# Patient Record
Sex: Female | Born: 1960 | Race: Black or African American | Hispanic: No | Marital: Married | Smoking: Never smoker
Health system: Southern US, Community
[De-identification: ages and names within clinical notes are randomized; demographics above are authoritative.]

## PROBLEM LIST (undated history)

## (undated) DIAGNOSIS — R7303 Prediabetes: Secondary | ICD-10-CM

---

## 2008-12-18 ENCOUNTER — Ambulatory Visit: Payer: Self-pay | Admitting: Internal Medicine

## 2010-10-15 IMAGING — CR DG HIP COMPLETE 2+V*L*
1 series · 2 of 2 positions shown · non-contrast
Comparison: None

REASON FOR EXAM: hip pain Please send result back to office [phone]
office [phone]
COMMENTS:

PROCEDURE:     DXR - DXR HIP LEFT COMPLETE  - December 18, 2008  [DATE]
RESULT:     History: Pain

[Series 1: view not recorded · 0.17mm/px · 2 of 2 slices shown]
[im 1/2]
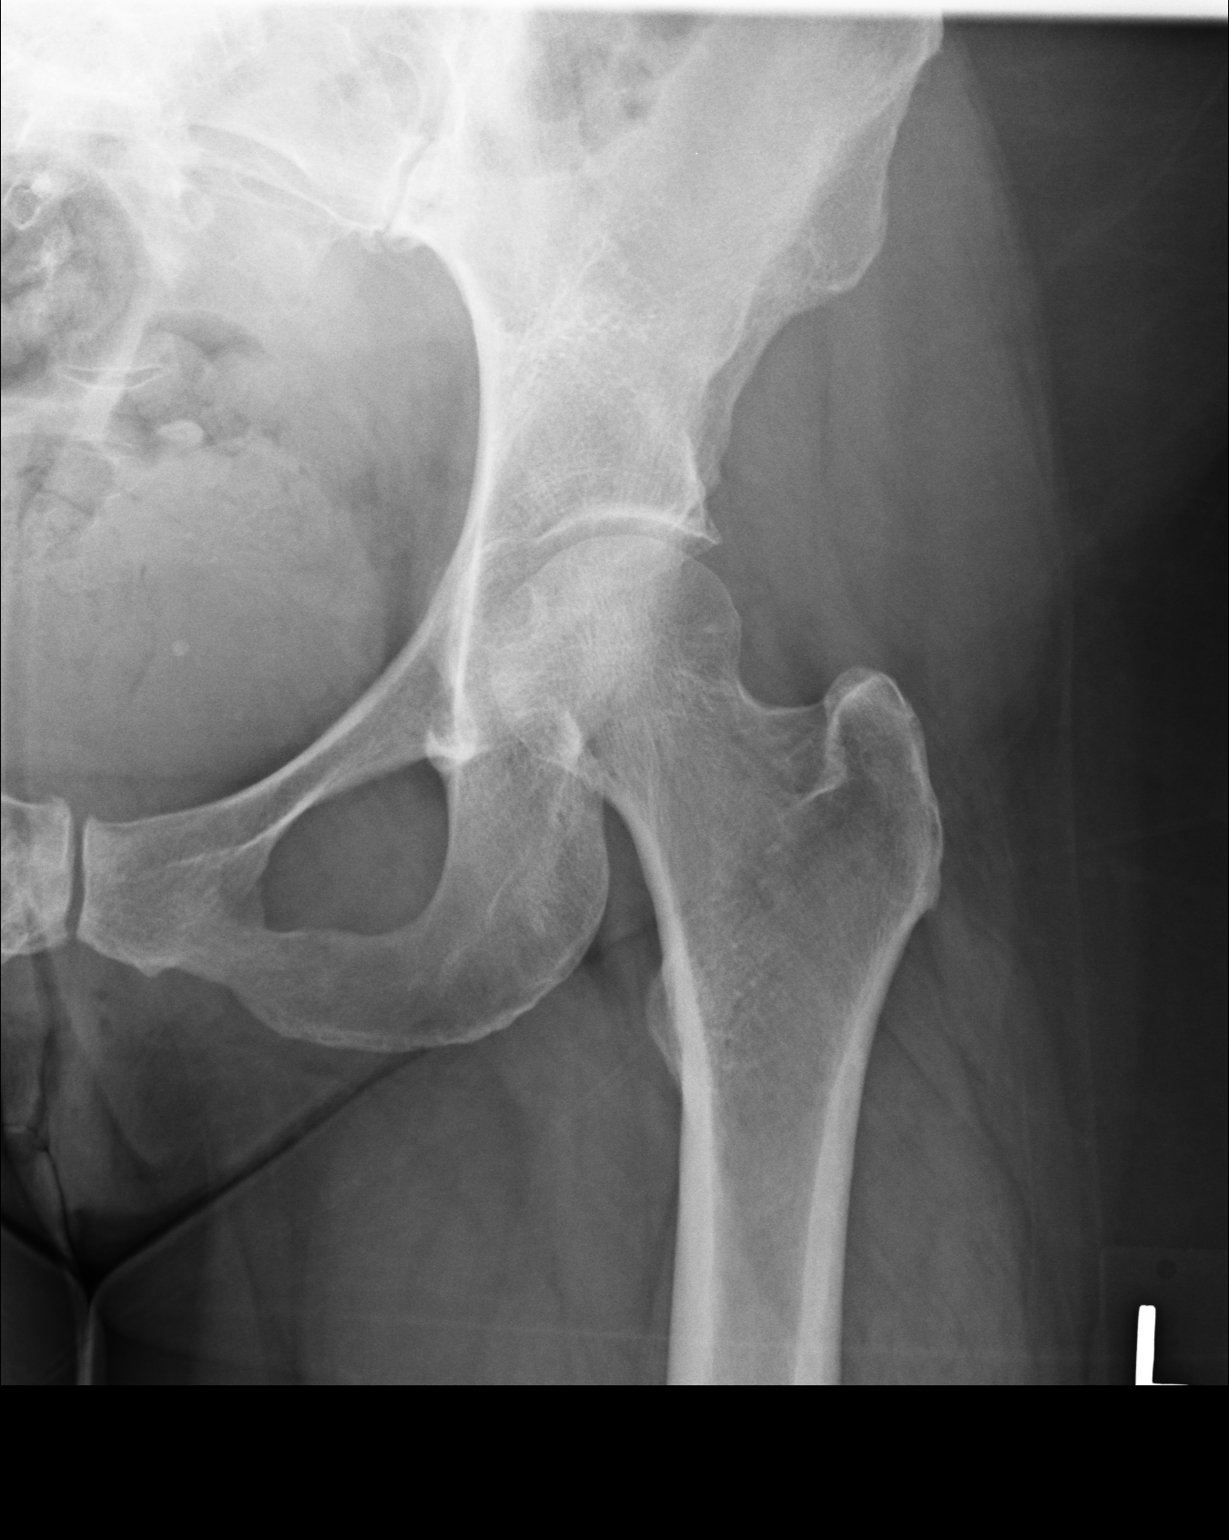
[im 2/2]
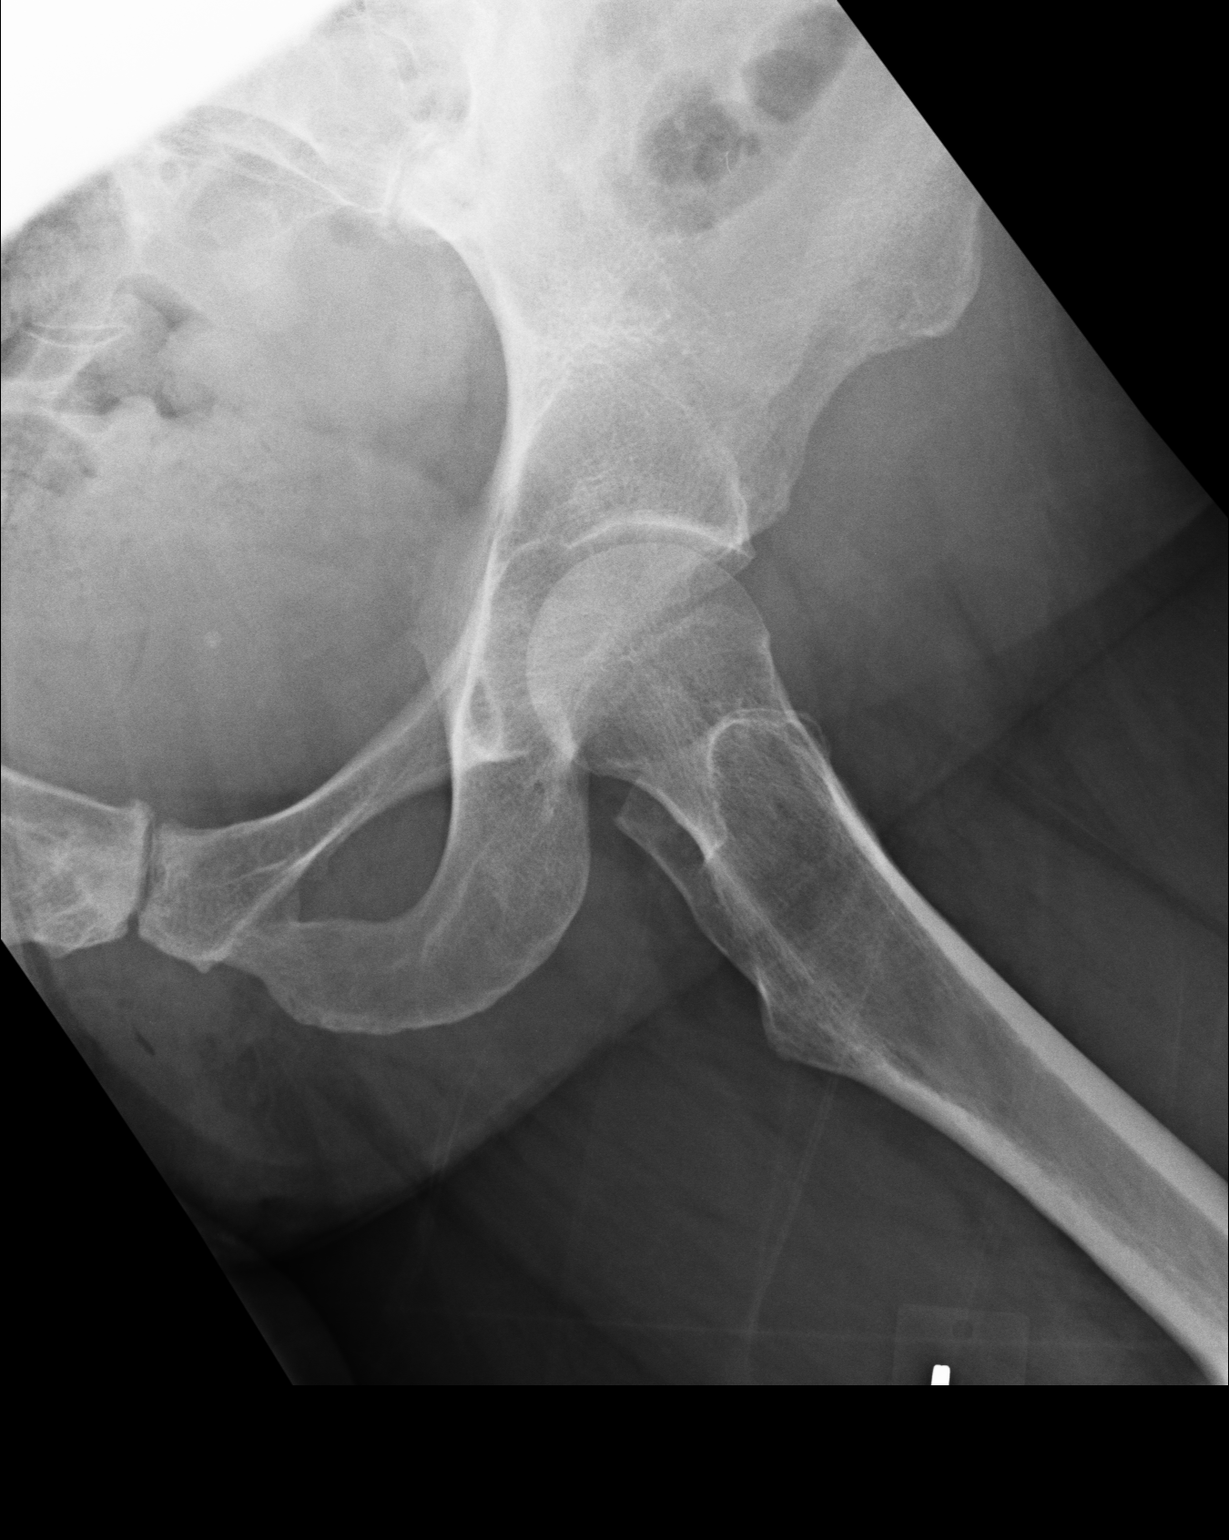

[2 of 2 positions shown; findings below may reference images not displayed]

FINDINGS: AP and frogleg lateral view of the left hip demonstrates no fracture or
dislocation. The joint spaces maintained.

The visualized left sacroiliac joint is unremarkable. There are mild
degenerative changes of the pubic symphysis.
IMPRESSION: No acute osseous injury of the left hip.

## 2010-10-15 IMAGING — US US EXTREM LOW VENOUS*L*
1 series · 17 of 24 positions shown · non-contrast
Comparison: none

REASON FOR EXAM: [id number]  PAIN AND SWELLING EVAL DVT
COMMENTS:

PROCEDURE:     US  - US DOPPLER LOW EXTR LEFT  - December 18, 2008  [DATE]
RESULT:     Comparison: No comparison

[Series 1: us extrem low venous*left* · 17 of 26 slices shown]
[im 1/26]
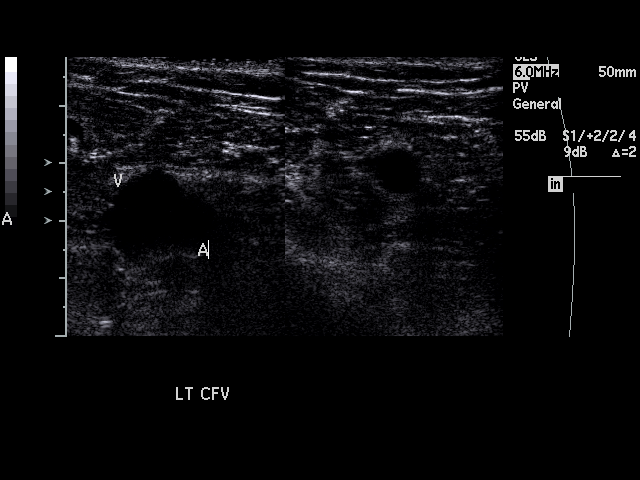
[im 3/26]
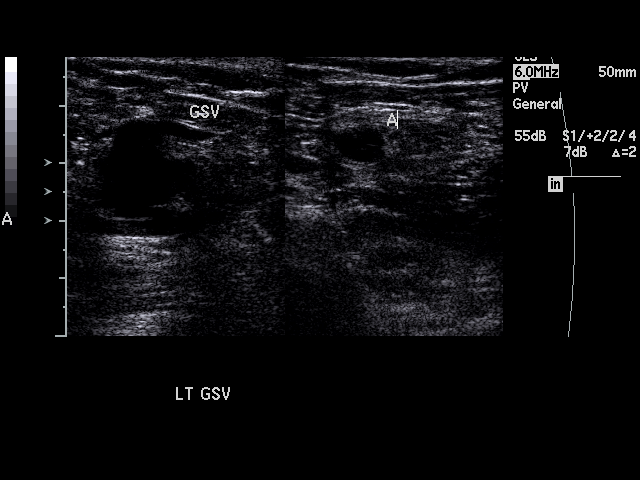
[im 4/26]
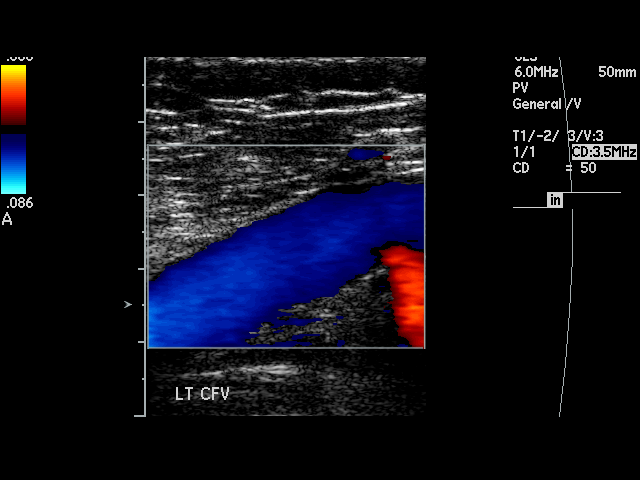
[im 5/26]
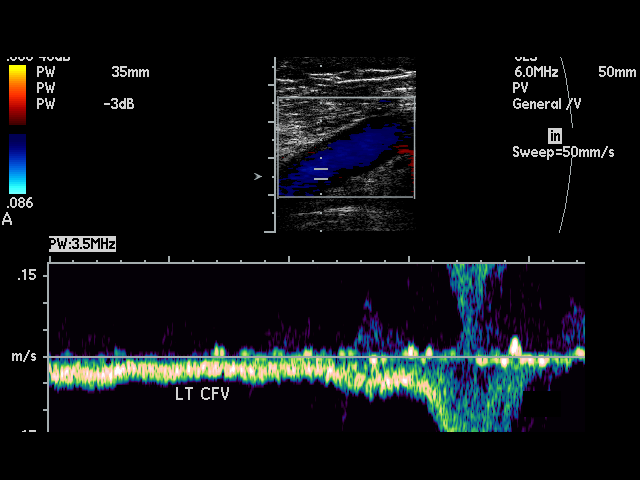
[im 7/26]
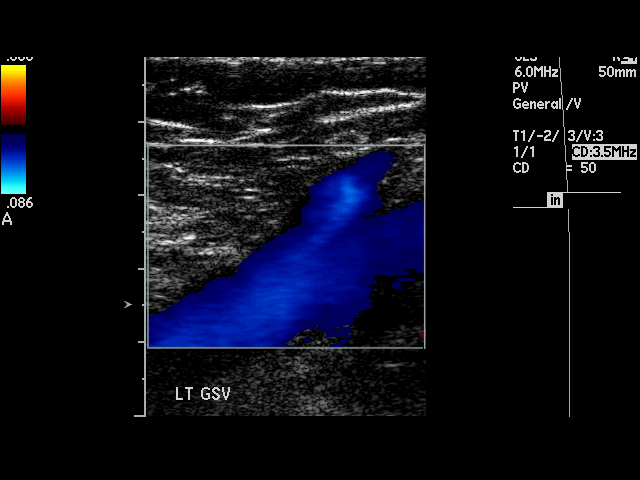
[im 8/26]
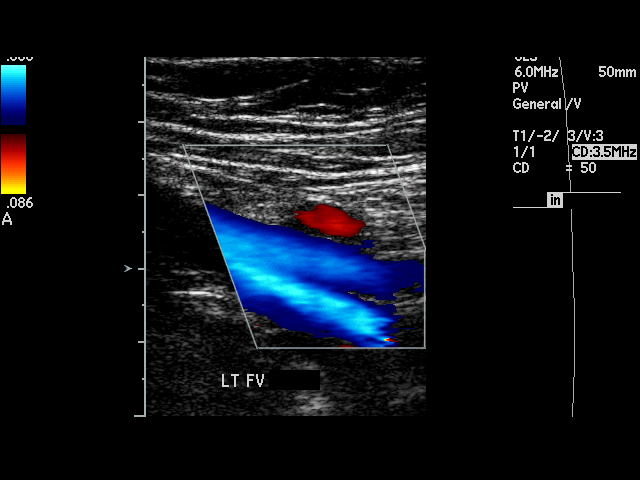
[im 10/26]
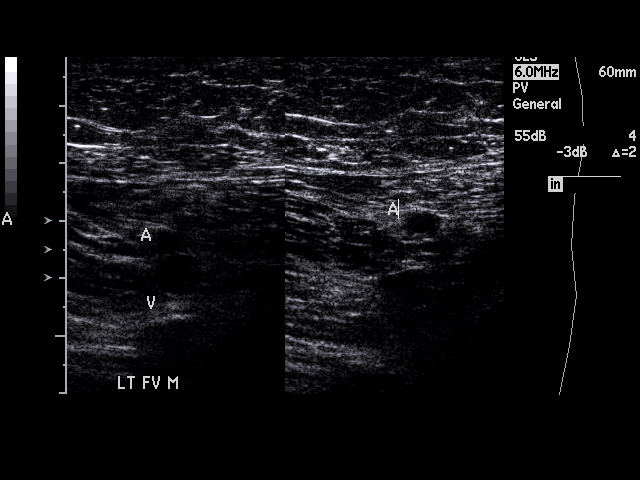
[im 11/26]
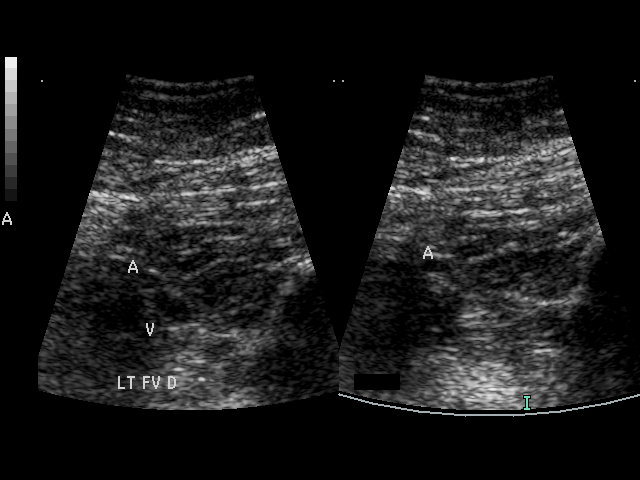
[im 14/26]
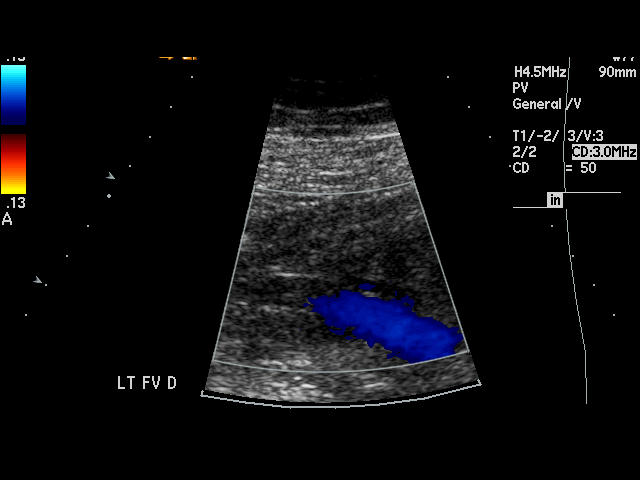
[im 15/26]
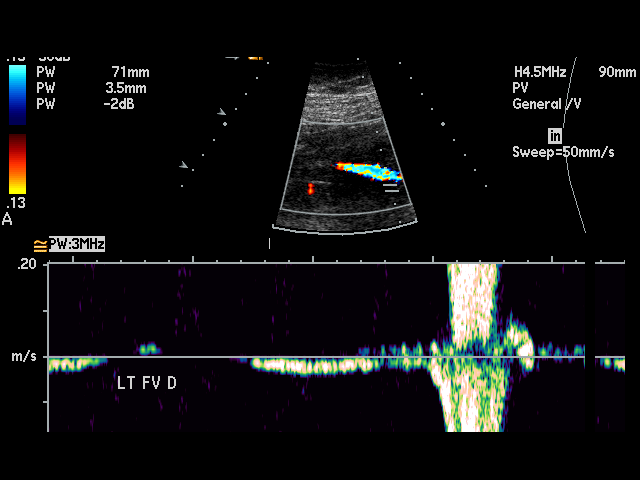
[im 16/26]
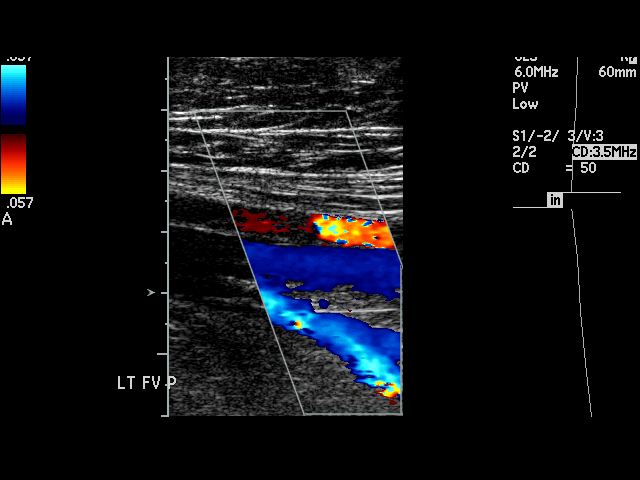
[im 18/26]
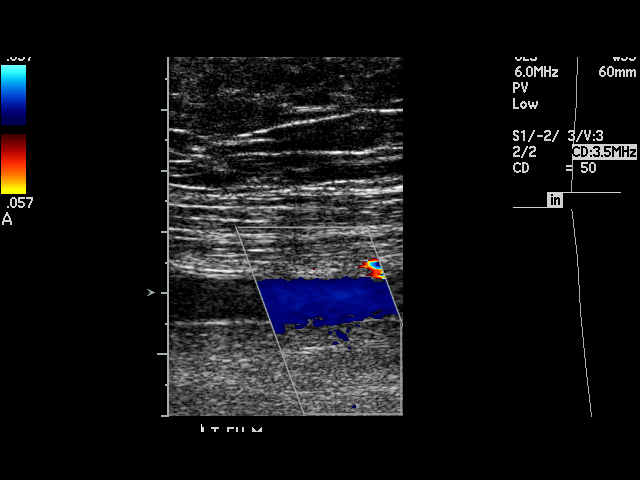
[im 19/26]
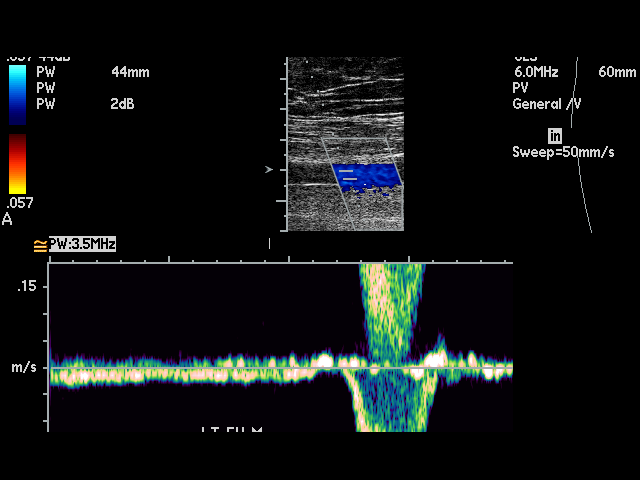
[im 21/26]
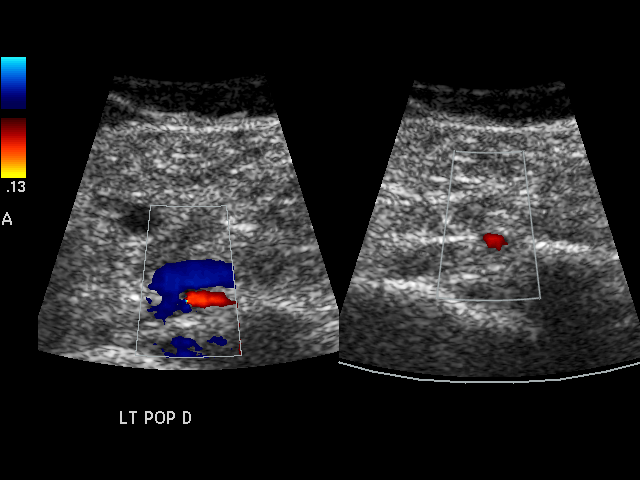
[im 22/26]
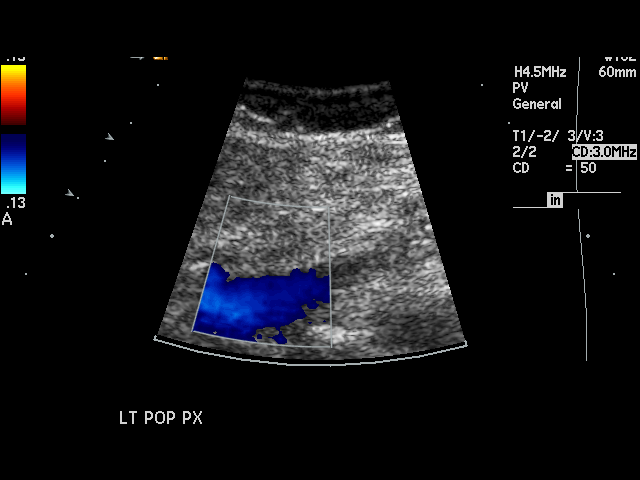
[im 23/26]
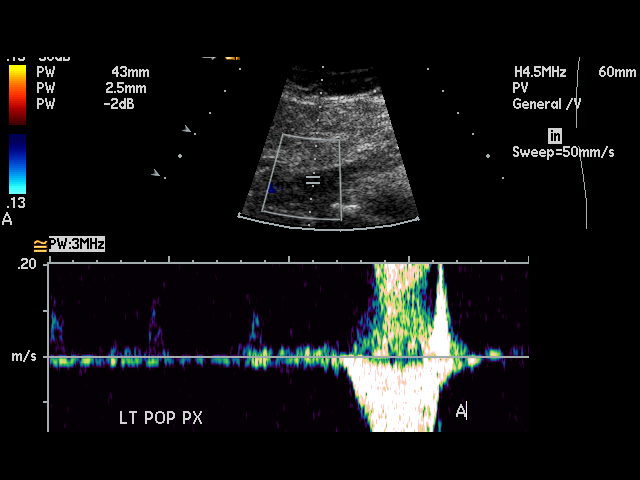
[im 26/26]
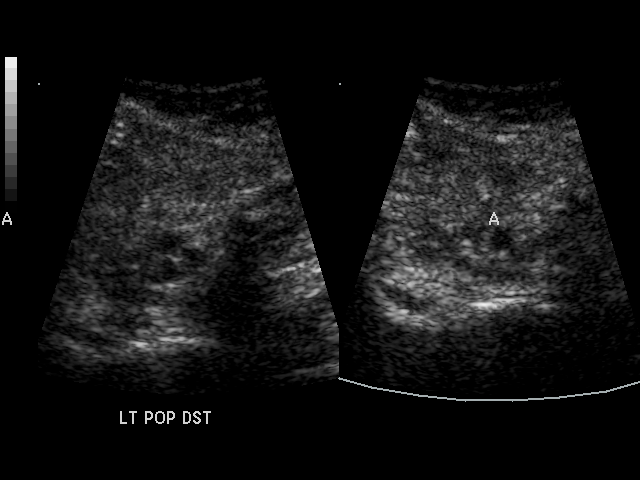

[17 of 24 positions shown; findings below may reference images not displayed]

FINDINGS: Multiple longitudinal and transverse gray-scale as well as color
and spectral Doppler images of the left lower extremity veins were obtained
from the common femoral veins through the popliteal veins.

The left common femoral, greater saphenous, femoral, popliteal veins, and
venous trifurcation are patent, demonstrating normal color-flow and
compressibility. No intraluminal thrombus is identified.There is normal
respiratory variation and augmentation demonstrated at all vein levels.
IMPRESSION: No evidence of DVT in the left lower extremity.

## 2019-12-27 ENCOUNTER — Ambulatory Visit: Payer: Self-pay | Attending: Family

## 2019-12-27 DIAGNOSIS — Z23 Encounter for immunization: Secondary | ICD-10-CM

## 2019-12-27 NOTE — Progress Notes (Signed)
   Covid-19 Vaccination Clinic  Name:  Sherry Chaney    MRN: [id number] DOB: 18-Mar-1961  12/27/2019  [name] was observed post Covid-19 immunization for 15 minutes without incident. She was provided with Vaccine Information Sheet and instruction to access the V-Safe system.   [name] was instructed to call 911 with any severe reactions post vaccine: Marland Kitchen Difficulty breathing  . Swelling of face and throat  . A fast heartbeat  . A bad rash all over body  . Dizziness and weakness   Immunizations Administered    Name Date Dose VIS Date Route   Moderna COVID-19 Vaccine 12/27/2019  1:31 PM 0.5 mL 09/04/2019 Intramuscular   Manufacturer: Moderna   Lot: [id number]   NDC: 38333-832-91

## 2020-01-29 ENCOUNTER — Ambulatory Visit: Payer: Self-pay | Attending: Family

## 2020-01-29 DIAGNOSIS — Z23 Encounter for immunization: Secondary | ICD-10-CM

## 2020-01-29 NOTE — Progress Notes (Signed)
   Covid-19 Vaccination Clinic  Name:  Sherry Chaney    MRN: [id number] DOB: [birth year]  01/29/2020  Ms. Raphael was observed post Covid-19 immunization for 15 minutes without incident. She was provided with Vaccine Information Sheet and instruction to access the V-Safe system.   Ms. Severtson was instructed to call 911 with any severe reactions post vaccine: Marland Kitchen Difficulty breathing  . Swelling of face and throat  . A fast heartbeat  . A bad rash all over body  . Dizziness and weakness   Immunizations Administered    Name Date Dose VIS Date Route   Moderna COVID-19 Vaccine 01/29/2020  4:15 PM 0.5 mL 09/2019 Intramuscular   Manufacturer: Moderna   Lot: [id number]   NDC: 29924-268-34

## 2020-08-22 ENCOUNTER — Ambulatory Visit: Payer: Self-pay

## 2020-09-02 ENCOUNTER — Ambulatory Visit: Payer: Self-pay | Attending: Internal Medicine

## 2020-09-02 DIAGNOSIS — Z23 Encounter for immunization: Secondary | ICD-10-CM

## 2020-09-02 NOTE — Progress Notes (Signed)
   Covid-19 Vaccination Clinic  Name:  Sherry Chaney    MRN: [id number] DOB: May 19, 1961  09/02/2020  [name] was observed post Covid-19 immunization for 15 minutes without incident. She was provided with Vaccine Information Sheet and instruction to access the V-Safe system.   [name] was instructed to call 911 with any severe reactions post vaccine: Marland Kitchen Difficulty breathing  . Swelling of face and throat  . A fast heartbeat  . A bad rash all over body  . Dizziness and weakness   Immunizations Administered    No immunizations on file.

## 2023-01-22 ENCOUNTER — Ambulatory Visit (HOSPITAL_COMMUNITY)
Admission: EM | Admit: 2023-01-22 | Discharge: 2023-01-22 | Disposition: A | Discharge status: Home / Self Care | Payer: Managed Care, Other (non HMO) | Attending: Physician Assistant | Admitting: Physician Assistant

## 2023-01-22 ENCOUNTER — Encounter (HOSPITAL_COMMUNITY): Payer: Self-pay

## 2023-01-22 DIAGNOSIS — Z760 Encounter for issue of repeat prescription: Secondary | ICD-10-CM | POA: Diagnosis present

## 2023-01-22 DIAGNOSIS — R7309 Other abnormal glucose: Secondary | ICD-10-CM | POA: Diagnosis present

## 2023-01-22 DIAGNOSIS — R7303 Prediabetes: Secondary | ICD-10-CM | POA: Diagnosis present

## 2023-01-22 HISTORY — DX: Prediabetes: R73.03

## 2023-01-22 LAB — COMPREHENSIVE METABOLIC PANEL
ALT: 24 U/L (ref 0–44)
AST: 25 U/L (ref 15–41)
Albumin: 3.5 g/dL (ref 3.5–5.0)
Alkaline Phosphatase: 49 U/L (ref 38–126)
Anion gap: 8 (ref 5–15)
BUN: 10 mg/dL (ref 8–23)
CO2: 25 mmol/L (ref 22–32)
Calcium: 9.2 mg/dL (ref 8.9–10.3)
Chloride: 105 mmol/L (ref 98–111)
Creatinine, Ser: 0.71 mg/dL (ref 0.44–1.00)
GFR, Estimated: >60 mL/min (ref >60)
Glucose, Bld: 86 mg/dL (ref 70–99)
Potassium: 3.6 mmol/L (ref 3.5–5.1)
Sodium: 138 mmol/L (ref 135–145)
Total Bilirubin: 0.2 mg/dL — ABNORMAL LOW (ref 0.3–1.2)
Total Protein: 6.5 g/dL (ref 6.5–8.1)

## 2023-01-22 LAB — HEMOGLOBIN A1C
Hgb A1c MFr Bld: 6 % — ABNORMAL HIGH (ref 4.8–5.6)
Mean Plasma Glucose: 125.5 mg/dL

## 2023-01-22 LAB — POCT URINALYSIS DIP (MANUAL ENTRY)
Bilirubin, UA: NEGATIVE
Glucose, UA: NEGATIVE mg/dL
Ketones, POC UA: NEGATIVE mg/dL
Leukocytes, UA: NEGATIVE
Nitrite, UA: NEGATIVE
Protein Ur, POC: NEGATIVE mg/dL
Spec Grav, UA: >=1.03 — AB (ref 1.010–1.025)
Urobilinogen, UA: 0.2 E.U./dL
pH, UA: 5.5 (ref 5.0–8.0)

## 2023-01-22 MED ORDER — METFORMIN HCL ER 500 MG PO TB24: ORAL_TABLET | ORAL | 1 refills | Status: AC

## 2023-01-22 NOTE — ED Triage Notes (Signed)
Pt states that she is here for a medication refill for her metformin.

## 2023-01-22 NOTE — ED Provider Notes (Signed)
[facility]    CSN: [id number] Arrival date & time: 01/22/23  1050      History   Chief Complaint Chief Complaint  Patient presents with   Medication Refill    HPI Sherry Chaney is a 62 y.o. female.   Patient presents today for medication refill.  Several years ago she was noted to have slightly elevated A1c in the prediabetes range and was started on metformin 500 mg nightly.  She has made significant changes to her lifestyle including increasing her exercise to 3 times a week and eating a very healthy diet.  As result she has lost approximately 58 pounds since initially being diagnosed.  She is doing well on metformin 500 mg extended release at night and is requesting a refill of this medicine.  Denies any associated side effects including GI upset.  She does not monitor her blood sugar at home.  She recently moved to the area does not have a primary care and is interested in establishing with someone.  She denies any polyuria, polydipsia, polyphagia.    Past Medical History:  Diagnosis Date   Pre-diabetes     There are no problems to display for this patient.   Past Surgical History:  Procedure Laterality Date   CESAREAN SECTION      OB History   No obstetric history on file.      Home Medications    Prior to Admission medications   Medication Sig Start Date End Date Taking? Authorizing Provider  metFORMIN (GLUCOPHAGE-XR) 500 MG 24 hr tablet SMARTSIG:1 Tablet(s) By Mouth Every Evening 01/22/23   Cassandra Mcmanaman, Noberto Retort, PA-C    Family History History reviewed. No pertinent family history.  Social History Social History   Tobacco Use   Smoking status: Never    Passive exposure: Never   Smokeless tobacco: Never  Vaping Use   Vaping Use: Never used  Substance Use Topics   Alcohol use: Not Currently   Drug use: Never     Allergies   Patient has no known allergies.   Review of Systems Review of Systems  Constitutional:  Negative for activity  change, appetite change, fatigue and fever.  Respiratory:  Negative for cough and shortness of breath.   Cardiovascular:  Negative for chest pain.  Gastrointestinal:  Negative for abdominal pain, diarrhea, nausea and vomiting.  Endocrine: Negative for polydipsia, polyphagia and polyuria.  Neurological:  Negative for dizziness, light-headedness and headaches.     Physical Exam Triage Vital Signs ED Triage Vitals  Enc Vitals Group     BP 01/22/23 1135 (!) 94/57     Pulse Rate 01/22/23 1135 68     Resp 01/22/23 1135 18     Temp 01/22/23 1135 98.3 F (36.8 C)     Temp Source 01/22/23 1135 Oral     SpO2 01/22/23 1135 96 %     Weight 01/22/23 1133 178 lb (80.7 kg)     Height --      Head Circumference --      Peak Flow --      Pain Score 01/22/23 1133 0     Pain Loc --      Pain Edu? --      Excl. in GC? --    No data found.  Updated Vital Signs BP 98/63 (BP Location: Right Arm)   Pulse 68   Temp 98.3 F (36.8 C) (Oral)   Resp 18   Wt 178 lb (80.7 kg)   SpO2  96%   Visual Acuity Right Eye Distance:   Left Eye Distance:   Bilateral Distance:    Right Eye Near:   Left Eye Near:    Bilateral Near:     Physical Exam Vitals reviewed.  Constitutional:      General: She is awake. She is not in acute distress.    Appearance: Normal appearance. She is well-developed. She is not ill-appearing.     Comments: Very pleasant female appears stated age in no acute distress sitting comfortably in exam room  HENT:     Head: Normocephalic and atraumatic.  Cardiovascular:     Rate and Rhythm: Normal rate and regular rhythm.     Heart sounds: Normal heart sounds, S1 normal and S2 normal. No murmur heard. Pulmonary:     Effort: Pulmonary effort is normal.     Breath sounds: Normal breath sounds. No wheezing, rhonchi or rales.     Comments: Clear to auscultation bilaterally Abdominal:     General: Bowel sounds are normal.     Palpations: Abdomen is soft.     Tenderness: There is  no abdominal tenderness. There is no right CVA tenderness, left CVA tenderness, guarding or rebound.  Musculoskeletal:     Right lower leg: No edema.     Left lower leg: No edema.  Psychiatric:        Behavior: Behavior is cooperative.      UC Treatments / Results  Labs (all labs ordered are listed, but only abnormal results are displayed) Labs Reviewed  POCT URINALYSIS DIP (MANUAL ENTRY) - Abnormal; Notable for the following components:      Result Value   Spec Grav, UA >=1.030 (*)    Blood, UA small (*)    All other components within normal limits  COMPREHENSIVE METABOLIC PANEL  HEMOGLOBIN A1C    EKG   Radiology No results found.  Procedures Procedures (including critical care time)  Medications Ordered in UC Medications - No data to display  Initial Impression / Assessment and Plan / UC Course  I have reviewed the triage vital signs and the nursing notes.  Pertinent labs & imaging results that were available during my care of the patient were reviewed by me and considered in my medical decision making (see chart for details).     Patient is well-appearing, afebrile, nontoxic, nontachycardic.  She is doing well on her current medication regimen and refill was provided per her request.  Basic labs including CMP and A1c were obtained and we will contact her if these are abnormal we need to change her treatment plan.  She was encouraged to continue with healthy lifestyle and weight loss efforts.  She does not currently have a primary care so we will try to establish her with someone via PCP assistance.  Discussed that if she has difficulty establishing with a primary care she can return here as needed.  If she develops any symptoms she is to return for reevaluation.  Her blood pressure was slightly low on intake.  Patient reports that it has been running lower since she is lost weight and she denies any lightheadedness, weakness, dizziness.  Urine was obtained that did show  increase specific gravity and she was encouraged to push fluids.  Recommended that she monitor her blood pressure at home and if this drops below 90/60 she should be reevaluated or if she develops any symptoms.  She expressed understanding.  Recommended that ideally I would like her to return here or see  primary care within a few weeks for recheck.  All questions answered to patient satisfaction.  Final Clinical Impressions(s) / UC Diagnoses   Final diagnoses:  Prediabetes  Dysglycemia  Encounter for medication refill     Discharge Instructions      I have called in a refill of your metformin.  Please take this as previously prescribed.  Continue with your healthy eating and exercise.  Someone should contact you to schedule an appoint with primary care.  If you develop any side effects with this medication or concerns please return for reevaluation.  Your blood pressure was slightly low.  This might be related to your weight loss but I am also concerned that he might be slightly dehydrated based on your urine result.  Please make sure that you are drinking plenty of fluid.  Monitor your blood pressure at home and if this drops below 90/60 you need to be seen immediately.  If you are unable to follow-up with primary care within a week or 2 please return here so we can reevaluate you.  If at any point you have lightheadedness, dizziness, weakness, fatigue you should be seen immediately.     ED Prescriptions     Medication Sig Dispense Auth. Provider   metFORMIN (GLUCOPHAGE-XR) 500 MG 24 hr tablet SMARTSIG:1 Tablet(s) By Mouth Every Evening 30 tablet Zaylan Kissoon K, PA-C      PDMP not reviewed this encounter.   Jeani Hawking, PA-C 01/22/23 1228

## 2023-01-22 NOTE — Discharge Instructions (Signed)
I have called in a refill of your metformin.  Please take this as previously prescribed.  Continue with your healthy eating and exercise.  Someone should contact you to schedule an appoint with primary care.  If you develop any side effects with this medication or concerns please return for reevaluation.  Your blood pressure was slightly low.  This might be related to your weight loss but I am also concerned that he might be slightly dehydrated based on your urine result.  Please make sure that you are drinking plenty of fluid.  Monitor your blood pressure at home and if this drops below 90/60 you need to be seen immediately.  If you are unable to follow-up with primary care within a week or 2 please return here so we can reevaluate you.  If at any point you have lightheadedness, dizziness, weakness, fatigue you should be seen immediately.

## 2023-03-23 ENCOUNTER — Other Ambulatory Visit (HOSPITAL_COMMUNITY): Payer: Self-pay

## 2023-03-23 MED ORDER — METFORMIN HCL ER 500 MG PO TB24
500.0000 mg | ORAL_TABLET | Freq: Every day | ORAL | 1 refills | Status: AC
  Filled 2023-03-23: qty 90, 90d supply, fill #0
  Filled 2023-06-12: qty 90, 90d supply, fill #1

## 2023-06-07 ENCOUNTER — Other Ambulatory Visit (HOSPITAL_COMMUNITY): Payer: Self-pay

## 2023-06-07 MED ORDER — METFORMIN HCL 500 MG PO TABS
500.0000 mg | ORAL_TABLET | Freq: Every day | ORAL | 0 refills | Status: DC
Start: 2023-06-07 — End: 2023-06-14
  Filled 2023-06-07: qty 90, 90d supply, fill #0

## 2023-06-13 ENCOUNTER — Other Ambulatory Visit (HOSPITAL_COMMUNITY): Payer: Self-pay

## 2023-06-14 ENCOUNTER — Other Ambulatory Visit (HOSPITAL_COMMUNITY): Payer: Self-pay
